# Patient Record
Sex: Male | Born: 1970 | Race: White | Hispanic: No | Marital: Single | Smoking: Former smoker
Health system: Southern US, Community
[De-identification: ages and names within clinical notes are randomized; demographics above are authoritative.]

## PROBLEM LIST (undated history)

## (undated) DIAGNOSIS — B20 Human immunodeficiency virus [HIV] disease: Secondary | ICD-10-CM

## (undated) DIAGNOSIS — Z21 Asymptomatic human immunodeficiency virus [HIV] infection status: Secondary | ICD-10-CM

## (undated) DIAGNOSIS — E785 Hyperlipidemia, unspecified: Secondary | ICD-10-CM

## (undated) DIAGNOSIS — F419 Anxiety disorder, unspecified: Secondary | ICD-10-CM

---

## 2014-09-29 ENCOUNTER — Emergency Department: Payer: BLUE CROSS/BLUE SHIELD

## 2014-09-29 ENCOUNTER — Encounter: Payer: Self-pay | Admitting: Emergency Medicine

## 2014-09-29 ENCOUNTER — Emergency Department
Admission: EM | Admit: 2014-09-29 | Discharge: 2014-09-29 | Disposition: A | Discharge status: Home / Self Care | Payer: BLUE CROSS/BLUE SHIELD | Attending: Emergency Medicine | Admitting: Emergency Medicine

## 2014-09-29 DIAGNOSIS — R000 Tachycardia, unspecified: Secondary | ICD-10-CM | POA: Diagnosis not present

## 2014-09-29 DIAGNOSIS — F419 Anxiety disorder, unspecified: Secondary | ICD-10-CM | POA: Diagnosis not present

## 2014-09-29 DIAGNOSIS — Z720 Tobacco use: Secondary | ICD-10-CM | POA: Insufficient documentation

## 2014-09-29 DIAGNOSIS — Z79899 Other long term (current) drug therapy: Secondary | ICD-10-CM | POA: Diagnosis not present

## 2014-09-29 DIAGNOSIS — R61 Generalized hyperhidrosis: Secondary | ICD-10-CM | POA: Diagnosis present

## 2014-09-29 DIAGNOSIS — R0789 Other chest pain: Secondary | ICD-10-CM | POA: Insufficient documentation

## 2014-09-29 DIAGNOSIS — E785 Hyperlipidemia, unspecified: Secondary | ICD-10-CM | POA: Diagnosis not present

## 2014-09-29 DIAGNOSIS — Z21 Asymptomatic human immunodeficiency virus [HIV] infection status: Secondary | ICD-10-CM | POA: Diagnosis not present

## 2014-09-29 DIAGNOSIS — M6281 Muscle weakness (generalized): Secondary | ICD-10-CM | POA: Insufficient documentation

## 2014-09-29 HISTORY — DX: Hyperlipidemia, unspecified: E78.5

## 2014-09-29 HISTORY — DX: Anxiety disorder, unspecified: F41.9

## 2014-09-29 HISTORY — DX: Asymptomatic human immunodeficiency virus (hiv) infection status: Z21

## 2014-09-29 HISTORY — DX: Human immunodeficiency virus (HIV) disease: B20

## 2014-09-29 LAB — COMPREHENSIVE METABOLIC PANEL
ALK PHOS: 95 U/L (ref 38–126)
ALT: 25 U/L (ref 17–63)
ANION GAP: 11 (ref 5–15)
AST: 40 U/L (ref 15–41)
Albumin: 4.3 g/dL (ref 3.5–5.0)
BILIRUBIN TOTAL: 1 mg/dL (ref 0.3–1.2)
BUN: 17 mg/dL (ref 6–20)
CALCIUM: 9.3 mg/dL (ref 8.9–10.3)
CO2: 22 mmol/L (ref 22–32)
Chloride: 103 mmol/L (ref 101–111)
Creatinine, Ser: 1.18 mg/dL (ref 0.61–1.24)
Glucose, Bld: 144 mg/dL — ABNORMAL HIGH (ref 65–99)
Potassium: 3.4 mmol/L — ABNORMAL LOW (ref 3.5–5.1)
Sodium: 136 mmol/L (ref 135–145)
TOTAL PROTEIN: 8 g/dL (ref 6.5–8.1)

## 2014-09-29 LAB — CBC WITH DIFFERENTIAL/PLATELET
BASOS ABS: 0 10*3/uL (ref 0–0.1)
BASOS PCT: 0 %
EOS PCT: 1 %
Eosinophils Absolute: 0.1 10*3/uL (ref 0–0.7)
HCT: 42.7 % (ref 40.0–52.0)
Hemoglobin: 14.2 g/dL (ref 13.0–18.0)
Lymphocytes Relative: 17 %
Lymphs Abs: 1.5 10*3/uL (ref 1.0–3.6)
MCH: 31.7 pg (ref 26.0–34.0)
MCHC: 33.2 g/dL (ref 32.0–36.0)
MCV: 95.4 fL (ref 80.0–100.0)
MONO ABS: 1 10*3/uL (ref 0.2–1.0)
Monocytes Relative: 11 %
Neutro Abs: 6.6 10*3/uL — ABNORMAL HIGH (ref 1.4–6.5)
Neutrophils Relative %: 71 %
PLATELETS: 274 10*3/uL (ref 150–440)
RBC: 4.47 MIL/uL (ref 4.40–5.90)
RDW: 13.5 % (ref 11.5–14.5)
WBC: 9.3 10*3/uL (ref 3.8–10.6)

## 2014-09-29 LAB — TROPONIN I: Troponin I: <0.03 ng/mL (ref <0.031)

## 2014-09-29 LAB — LIPASE, BLOOD: Lipase: 27 U/L (ref 22–51)

## 2014-09-29 LAB — MAGNESIUM: MAGNESIUM: 1.9 mg/dL (ref 1.7–2.4)

## 2014-09-29 MED ORDER — SODIUM CHLORIDE 0.9 % IV BOLUS (SEPSIS)
1000.0000 mL | INTRAVENOUS | Status: AC
  Administered 2014-09-29: 1000 mL via INTRAVENOUS

## 2014-09-29 MED ORDER — ASPIRIN 81 MG PO CHEW: 324.0000 mg | CHEWABLE_TABLET | Freq: Once | ORAL | Status: DC

## 2014-09-29 NOTE — ED Provider Notes (Signed)
[facility] Emergency Department Provider Note  ____________________________________________  Time seen: Approximately 12:49 PM  I have reviewed the triage vital signs and the nursing notes.   HISTORY  Chief Complaint Leg Pain and Arm Pain    HPI Alan Deleon is a 44 y.o. male with a history of HIV (followed at [facility], well-controlled with last CD4 count several weeks ago greater than 900 and undetectable viral load) and severe anxiety who presents after an acute episode that started with a combination of "dizziness", left sided numbness/weakness of his left arm and left leg, and generalized, nonfocal chest pressure and diaphoresis.  He states that while this episode was happening he could not lift anything with his left arm and that he had to sit down before he fell down.  He was trying to speak but states that he could not force his mouth to make the words.  The whole episode lasted about 20 minutes and now he feels "95% back to normal".  He denies headache, chest pain (just the initial feeling of pressure), fever/chills, abdominal pain.  He had nausea with no vomiting and states he has had diarrhea several times recently.  He denies dysuria.  He has never had episodes like this in the past; he states that it "might be my anxiety, but it is much worse than before".  However, he also states that he has been under more stress than ever recently and yesterday was "the most stressful day of my life".  It not take his Klonopin until just before the episode began; he states that usually it takes 30-45 minutes for the Klonopin to start working, and he took his pill only about 10 minutes or less before the episode today.  He currently feels much better and denies any persistent focal neurological issues at this time.   Past Medical History  Diagnosis Date  . HIV (human immunodeficiency virus infection)   . Anxiety   . Hyperlipidemia     There are no active problems to display  for this patient.   No past surgical history on file.  Current Outpatient Rx  Name  Route  Sig  Dispense  Refill  . clonazePAM (KLONOPIN) 1 MG tablet   Oral   Take 1 mg by mouth 2 (two) times daily.           Allergies Bactrim  History reviewed. No pertinent family history.  Social History Social History  Substance Use Topics  . Smoking status: Current Every Day Smoker -- 1.00 packs/day  . Smokeless tobacco: None  . Alcohol Use: Yes    Review of Systems Constitutional: No fever/chills.  Diaphoresis with this episode. Eyes: No visual changes. ENT: No sore throat.  Reports he was having trouble forming words and that he bit his tongue.  He also reports difficulty swallowing but this is been the case for weeks Cardiovascular: Denies chest pain but does report chest pressure or discomfort during his "episode"  Respiratory: Denies shortness of breath. Gastrointestinal: No abdominal pain.  nausea, no vomiting.  Recent diarrhea.  No constipation. Genitourinary: Negative for dysuria. Musculoskeletal: Negative for back pain. Skin: Negative for rash. Neurological: Reports significant left arm and left leg weakness as well as apparent aphasia which all lasted about 20 minutes and is now resolved  10-point ROS otherwise negative.  ____________________________________________   PHYSICAL EXAM:  VITAL SIGNS: ED Triage Vitals  Enc Vitals Group     BP 09/29/14 1233 146/120 mmHg     Pulse Rate  09/29/14 1233 116     Resp 09/29/14 1233 17     Temp 09/29/14 1233 98.1 F (36.7 C)     Temp Source 09/29/14 1233 Oral     SpO2 09/29/14 1233 95 %     Weight 09/29/14 1233 200 lb (90.719 kg)     Height 09/29/14 1233 5' 10"  (1.778 m)     Head Cir --      Peak Flow --      Pain Score 09/29/14 1235 4     Pain Loc --      Pain Edu? --      Excl. in [place]? --     Constitutional: Alert and oriented. Well appearing and in no acute distress.  Mildly tachycardic in the 110s.   Eyes:  Conjunctivae are normal. PERRL. EOMI. Head: Atraumatic. Nose: No congestion/rhinnorhea. Mouth/Throat: Mucous membranes are moist.  Oropharynx non-erythematous. Neck: No stridor.   Cardiovascular: Tachycardia, regular rhythm. Grossly normal heart sounds.  Good peripheral circulation. Respiratory: Normal respiratory effort.  No retractions. Lungs CTAB. Gastrointestinal: Soft and nontender. No distention. No abdominal bruits. No CVA tenderness. Musculoskeletal: No lower extremity tenderness nor edema.  No joint effusions. Neurologic:  Normal speech and language. No gross focal neurologic deficits are appreciated; the patient has 5 out of 5 strength in bilateral upper and lower extremities with flexion/extension and abduction/adduction of all major muscle groups.  Sensation is intact throughout.  Finger to nose is normal with no evidence of dysmetria.  No ataxia. Skin:  Skin is warm, dry and intact. No rash noted. Psychiatric: Mood and affect are normal. Speech and behavior are normal.  ____________________________________________   LABS (all labs ordered are listed, but only abnormal results are displayed)  Labs Reviewed  COMPREHENSIVE METABOLIC PANEL - Abnormal; Notable for the following:    Potassium 3.4 (*)    Glucose, Bld 144 (*)    All other components within normal limits  CBC WITH DIFFERENTIAL/PLATELET - Abnormal; Notable for the following:    Neutro Abs 6.6 (*)    All other components within normal limits  LIPASE, BLOOD  MAGNESIUM  TROPONIN I  TROPONIN I   ____________________________________________  EKG  ED ECG REPORT I, FORBACH, CORY, the attending physician, personally viewed and interpreted this ECG.  Date: 09/29/2014 EKG Time: 12:32 Rate: 121 Rhythm: Sinus tachycardia QRS Axis: normal Intervals: normal ST/T Wave abnormalities: normal Conduction Disutrbances: none Narrative Interpretation:  unremarkable  ____________________________________________  RADIOLOGY   Ct Head Wo Contrast  09/29/2014   CLINICAL DATA:  44 year old male with acute left-sided numbness today.  EXAM: CT HEAD WITHOUT CONTRAST  TECHNIQUE: Contiguous axial images were obtained from the base of the skull through the vertex without intravenous contrast.  COMPARISON:  None.  FINDINGS: No intracranial abnormalities are identified, including mass lesion or mass effect, hydrocephalus, extra-axial fluid collection, midline shift, hemorrhage, or acute infarction.  The visualized bony calvarium is unremarkable.  IMPRESSION: Unremarkable noncontrast head CT.   Electronically Signed   By: Margarette Canada M.D.   On: 09/29/2014 13:34   Mr Brain Wo Contrast  09/29/2014   CLINICAL DATA:  44 year old HIV positive male with hyperlipidemia presenting with left-sided pain and weakness with difficulty speaking earlier today. Symptoms now resolved. Subsequent encounter.  EXAM: MRI HEAD WITHOUT CONTRAST  TECHNIQUE: Multiplanar, multiecho pulse sequences of the brain and surrounding structures were obtained without intravenous contrast.  COMPARISON:  09/29/2014 CT.  No comparison MR.  FINDINGS: No acute infarct.  Mild white matter type changes which  may reflect result of small vessel disease small vessel disease type. Other causes of white matter type changes such as that secondary to inflammatory process, vasculitis, demyelinating process or migraine headaches are less likely considerations.  No intracranial hemorrhage.  No hydrocephalus.  No intracranial mass lesion noted on this unenhanced exam.  Major intracranial vascular structures are patent.  Cervical medullary junction, pituitary region, pineal region and orbital structures unremarkable.  IMPRESSION: No acute infarct.  Mild white matter type changes may reflect result of small vessel disease as noted above.   Electronically Signed   By: Genia Del M.D.   On: 09/29/2014 15:15   Dg Chest  Portable 1 View  09/29/2014   CLINICAL DATA:  acute onset CP w/ neurological deficits, HIV positive  EXAM: PORTABLE CHEST - 1 VIEW  COMPARISON:  None.  FINDINGS: The heart size and mediastinal contours are within normal limits. Both lungs are clear. The visualized skeletal structures are unremarkable.  IMPRESSION: No active disease.   Electronically Signed   By: Skipper Cliche M.D.   On: 09/29/2014 13:23    ____________________________________________   PROCEDURES  Procedure(s) performed: None  Critical Care performed: No ____________________________________________   INITIAL IMPRESSION / ASSESSMENT AND PLAN / ED COURSE  Pertinent labs & imaging results that were available during my care of the patient were reviewed by me and considered in my medical decision making (see chart for details).  NIH Stroke Scale  Interval: Baseline Time: Approximately 12:50 PM Person Administering Scale: FORBACH, CORY  Administer stroke scale items in the order listed. Record performance in each category after each subscale exam. Do not go back and change scores. Follow directions provided for each exam technique. Scores should reflect what the patient does, not what the clinician thinks the patient can do. The clinician should record answers while administering the exam and work quickly. Except where indicated, the patient should not be coached (i.e., repeated requests to patient to make a special effort).   1a  Level of consciousness: 0=alert; keenly responsive  1b. LOC questions:  0=Performs both tasks correctly  1c. LOC commands: 0=Performs both tasks correctly  2.  Best Gaze: 0=normal  3.  Visual: 0=No visual loss  4. Facial Palsy: 0=Normal symmetric movement  5a.  Motor left arm: 0=No drift, limb holds 90 (or 45) degrees for full 10 seconds  5b.  Motor right arm: 0=No drift, limb holds 90 (or 45) degrees for full 10 seconds  6a. motor left leg: 0=No drift, limb holds 90 (or 45) degrees for  full 10 seconds  6b  Motor right leg:  0=No drift, limb holds 90 (or 45) degrees for full 10 seconds  7. Limb Ataxia: 0=Absent  8.  Sensory: 0=Normal; no sensory loss  9. Best Language:  0=No aphasia, normal  10. Dysarthria: 0=Normal  11. Extinction and Inattention: 0=No abnormality  12. Distal motor function: 0=Normal   Total:   0   While the symptoms the patient describes are concerning for CVA/TIA, he currently has an NIH stroke scale is 0 and does not qualify as a candidate for TPA given the rapid improvement of symptoms and the current apparent complete resolution of the symptoms.  I suspect that this is all related to his anxiety, but his constellation of symptoms reported are concerning.  Given the chest tightness/pressure and neurological deficits, I specifically and especially considered aortic dissection in my differential.  However he insists that he never had chest "pain", just a feeling of tightness and  pressure while he was having his other symptoms.  He has essentially equal pulses in each arm, no widened mediastinum, normal pulses distally, and continues to have no chest pain.  I believe that there is no indication for pursuing a CT angiography of his chest at this time as aortic dissection is very unlikely at this point.  Given his constellation of symptoms including the focal neurological deficits and aphasia which is since resolved, I will pursue an MRI of the brain though I suspect this will be normal.  If he continues to feel well and is asymptomatic anticipate he will be appropriate for outpatient follow-up.  I am giving him a liter of normal saline for his mild tachycardia and he is are taken a Klonopin 1 mg by mouth prior to arrival.  I will continue to evaluate and reassess.  ----------------------------------------- 4:24 PM on 09/29/2014 -----------------------------------------  The patient's heart rate has resolved down to the 80s after a liter of normal saline.  He has  been asymptomatic throughout his stay in the emergency department.  All of his workup was reassuring.  I had a discussion with him and his mother.  I explained that while I cannot definitively rule out a TIA, I believe that it is much more likely that anxiety is the cause of his symptoms and his episode today.  He is enthusiastically agreed.  The plan at this time is for the patient to follow up with his regular doctor as well as attempting to establish primary care closer to [facility].  I gave him my usual and customary return precautions and the patient understands and agrees with plan.  ____________________________________________  FINAL CLINICAL IMPRESSION(S) / ED DIAGNOSES  Final diagnoses:  Anxiety disorder, unspecified anxiety disorder type      NEW MEDICATIONS STARTED DURING THIS VISIT:  Discharge Medication List as of 09/29/2014  4:27 PM       Hinda Kehr, MD 09/29/14 1718

## 2014-09-29 NOTE — ED Notes (Signed)
Patient transported to MRI. Pt taken to MRI by this RN.

## 2014-09-29 NOTE — ED Notes (Signed)
Pt arrives via [facility].  Upon arrival to ED, pt c/o of left arm pain and left leg pain.  Pt reports that at onset of symptoms he had difficulty talking. He took 324 ASA prior to EMS arrival.

## 2014-09-29 NOTE — Discharge Instructions (Signed)
As we discussed, your evaluation was reassuring today.  While we cannot 100% rule out that you had a transient ischemic attack (TIA), we all agree it is much more likely that you suffered from an especially severe anxiety attack.  If you are not already taking a low-dose 81 mg aspirin daily, I encourage you to do so, just in case.  Follow-up with your regular doctor at the next available opportunity to discuss your episode today and your emergency department visit.  If he develop new or worsening symptoms that concern you, please return immediately to the emergency department.  I included some information at the back of this packet about TIAs that I encourage you to read through and discussed with your regular doctor.  Panic Attacks Panic attacks are sudden, short-livedsurges of severe anxiety, fear, or discomfort. They may occur for no reason when you are relaxed, when you are anxious, or when you are sleeping. Panic attacks may occur for a number of reasons:   Healthy people occasionally have panic attacks in extreme, life-threatening situations, such as war or natural disasters. Normal anxiety is a protective mechanism of the body that helps [place] react to danger (fight or flight response).  Panic attacks are often seen with anxiety disorders, such as panic disorder, social anxiety disorder, generalized anxiety disorder, and phobias. Anxiety disorders cause excessive or uncontrollable anxiety. They may interfere with your relationships or other life activities.  Panic attacks are sometimes seen with other mental illnesses, such as depression and posttraumatic stress disorder.  Certain medical conditions, prescription medicines, and drugs of abuse can cause panic attacks. SYMPTOMS  Panic attacks start suddenly, peak within 20 minutes, and are accompanied by four or more of the following symptoms:  Pounding heart or fast heart rate (palpitations).  Sweating.  Trembling or shaking.  Shortness of  breath or feeling smothered.  Feeling choked.  Chest pain or discomfort.  Nausea or strange feeling in your stomach.  Dizziness, light-headedness, or feeling like you will faint.  Chills or hot flushes.  Numbness or tingling in your lips or hands and feet.  Feeling that things are not real or feeling that you are not yourself.  Fear of losing control or going crazy.  Fear of dying. Some of these symptoms can mimic serious medical conditions. For example, you may think you are having a heart attack. Although panic attacks can be very scary, they are not life threatening. DIAGNOSIS  Panic attacks are diagnosed through an assessment by your health care provider. Your health care provider will ask questions about your symptoms, such as where and when they occurred. Your health care provider will also ask about your medical history and use of alcohol and drugs, including prescription medicines. Your health care provider may order blood tests or other studies to rule out a serious medical condition. Your health care provider may refer you to a mental health professional for further evaluation. TREATMENT   Most healthy people who have one or two panic attacks in an extreme, life-threatening situation will not require treatment.  The treatment for panic attacks associated with anxiety disorders or other mental illness typically involves counseling with a mental health professional, medicine, or a combination of both. Your health care provider will help determine what treatment is best for you.  Panic attacks due to physical illness usually go away with treatment of the illness. If prescription medicine is causing panic attacks, talk with your health care provider about stopping the medicine, decreasing the dose, or  substituting another medicine.  Panic attacks due to alcohol or drug abuse go away with abstinence. Some adults need professional help in order to stop drinking or using drugs. HOME  CARE INSTRUCTIONS   Take all medicines as directed by your health care provider.   Schedule and attend follow-up visits as directed by your health care provider. It is important to keep all your appointments. SEEK MEDICAL CARE IF:  You are not able to take your medicines as prescribed.  Your symptoms do not improve or get worse. SEEK IMMEDIATE MEDICAL CARE IF:   You experience panic attack symptoms that are different than your usual symptoms.  You have serious thoughts about hurting yourself or others.  You are taking medicine for panic attacks and have a serious side effect. MAKE SURE YOU:  Understand these instructions.  Will watch your condition.  Will get help right away if you are not doing well or get worse. Document Released: 12/27/2004 Document Revised: 01/01/2013 Document Reviewed: 08/10/2012 [facility] Patient Information 2015 [place], [place]. This information is not intended to replace advice given to you by your health care provider. Make sure you discuss any questions you have with your health care provider.

## 2016-12-13 IMAGING — MR MR HEAD W/O CM
10 series · 41 of 48 positions shown · non-contrast
Comparison: 09/29/2014 CT.  No comparison MR.

CLINICAL DATA: 48-year-old HIV positive male with hyperlipidemia
presenting with left-sided pain and weakness with difficulty
speaking earlier today. Symptoms now resolved. Subsequent encounter.

EXAM:
MRI HEAD WITHOUT CONTRAST
TECHNIQUE: Multiplanar, multiecho pulse sequences of the brain and surrounding
structures were obtained without intravenous contrast.

[Series 2: T1 · sagittal · 5.0mm · 0.45mm/px · 4 of 25 slices shown (1 of 2)]
[im 1/25]
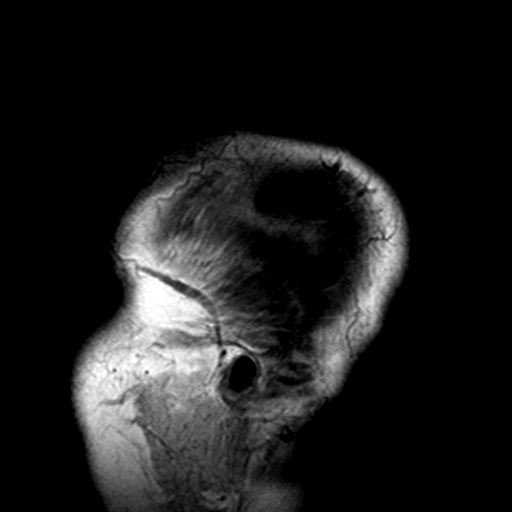
[im 9/25]
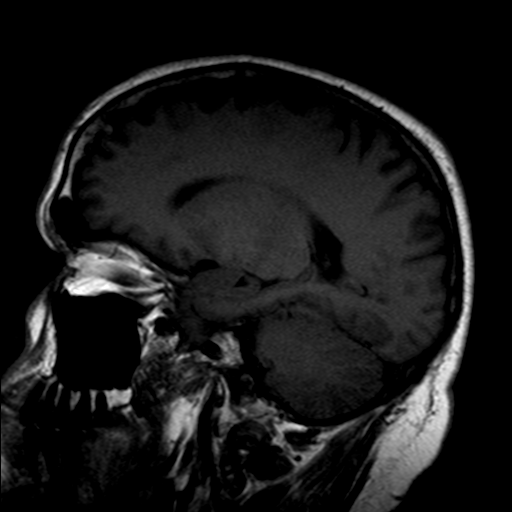
[im 17/25]
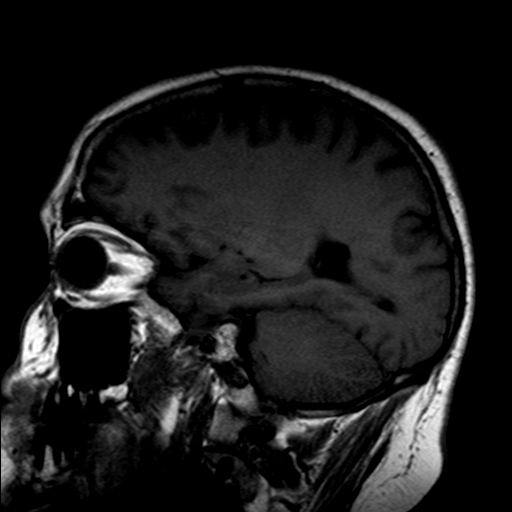
[im 25/25]
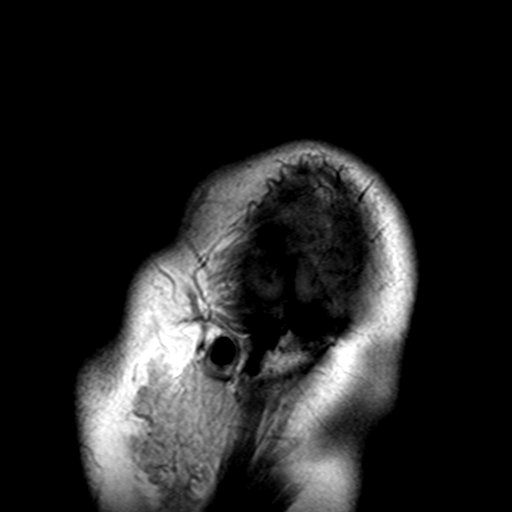

[Series 4: DWI · axial · 4.0mm · 0.94mm/px · z∈[-56,+111]mm · 7 of 44 slices shown (1 of 4)]
[im 1/44]
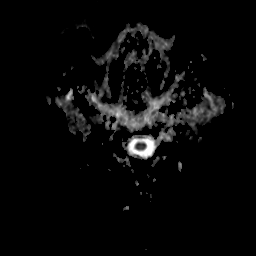
[im 8/44]
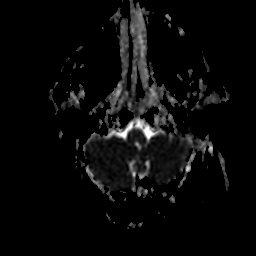
[im 15/44]
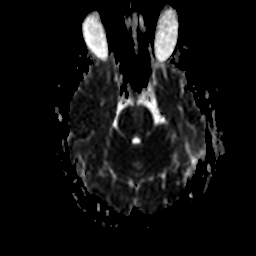
[im 22/44]
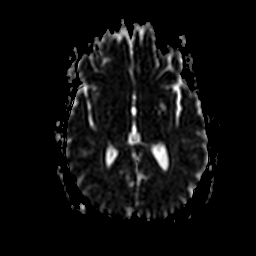
[im 29/44]
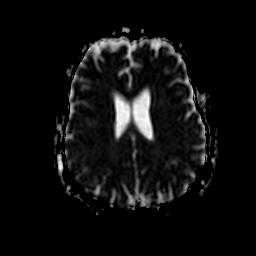
[im 36/44]
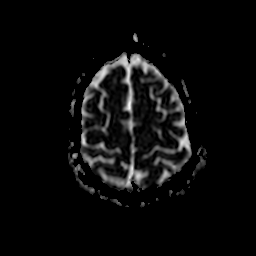
[im 44/44]
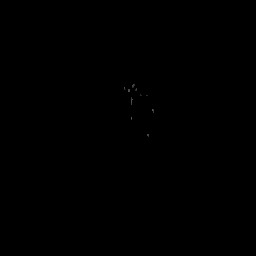

[Series 5: DWI · axial · 4.0mm · 0.94mm/px · z∈[-56,+111]mm · 6 of 44 slices shown (2 of 4)]
[im 1/44]
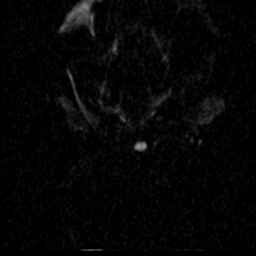
[im 9/44]
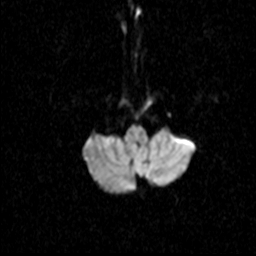
[im 18/44]
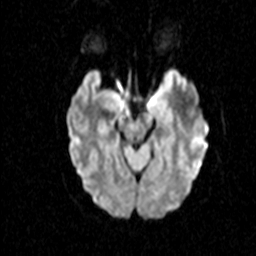
[im 26/44]
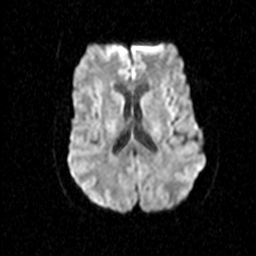
[im 35/44]
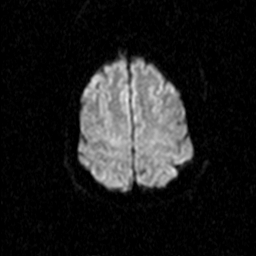
[im 44/44]
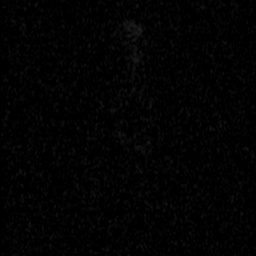

[Series 7: DWI · coronal · 5.0mm · 1.80mm/px · 5 of 37 slices shown (3 of 4)]
[im 1/37]
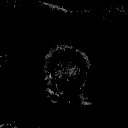
[im 10/37]
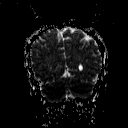
[im 19/37]
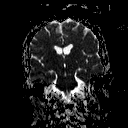
[im 28/37]
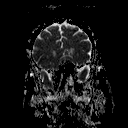
[im 37/37]
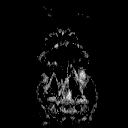

[Series 8: DWI · coronal · 5.0mm · 1.80mm/px · 5 of 36 slices shown (4 of 4)]
[im 1/36]
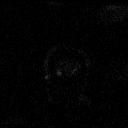
[im 9/36]
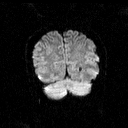
[im 18/36]
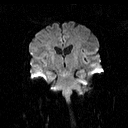
[im 27/36]
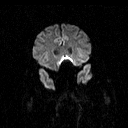
[im 36/36]
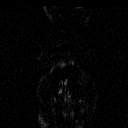

[Series 9: T2 · axial · 5.0mm · 0.45mm/px · z∈[-46,+106]mm · 3 of 25 slices shown (1 of 3)]
[im 1/25]
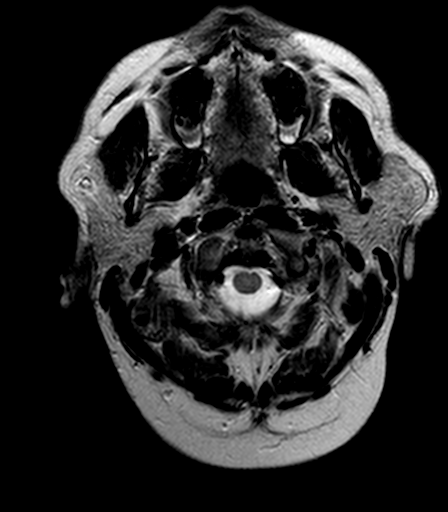
[im 13/25]
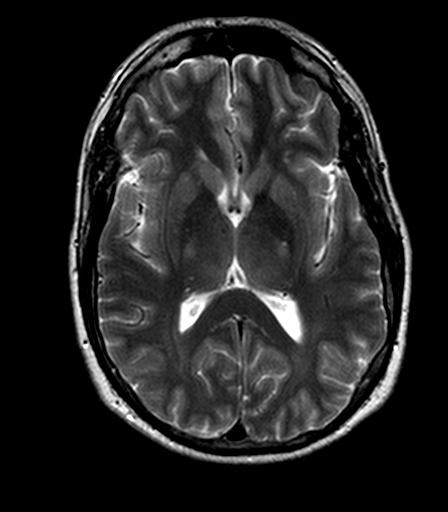
[im 25/25]
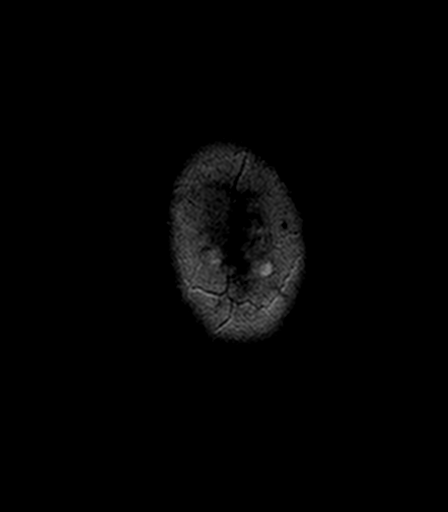

[Series 10: FLAIR · axial · 5.0mm · 0.90mm/px · z∈[-45,+107]mm · 3 of 25 slices shown]
[im 1/25]
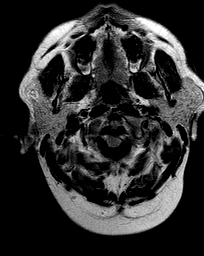
[im 13/25]
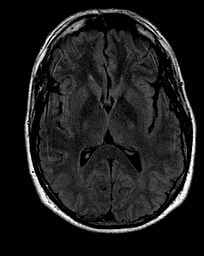
[im 25/25]
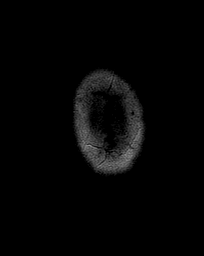

[Series 11: T2 · axial · 5.0mm · 0.45mm/px · z∈[-50,+102]mm · 3 of 25 slices shown (2 of 3)]
[im 1/25]
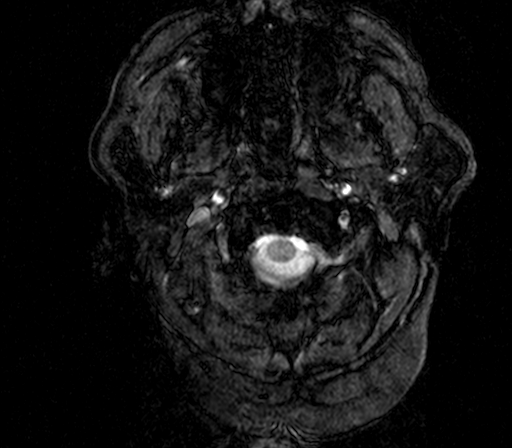
[im 13/25]
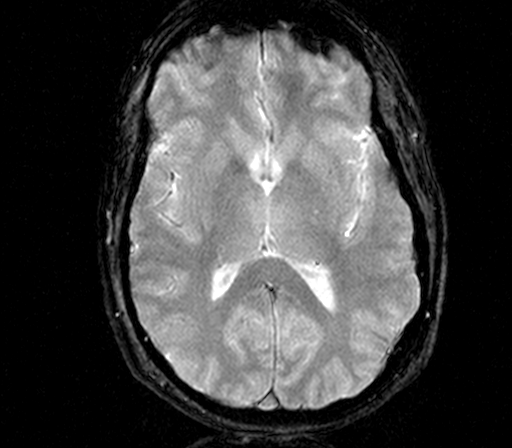
[im 25/25]
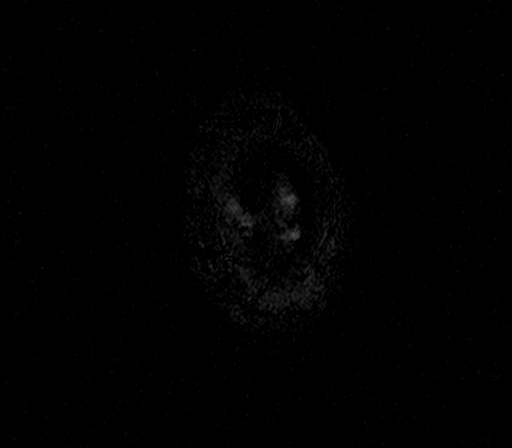

[Series 12: T1 · axial · 3.0mm · 0.45mm/px · 1 of 56 slices shown (2 of 2)]
[im 1/56]
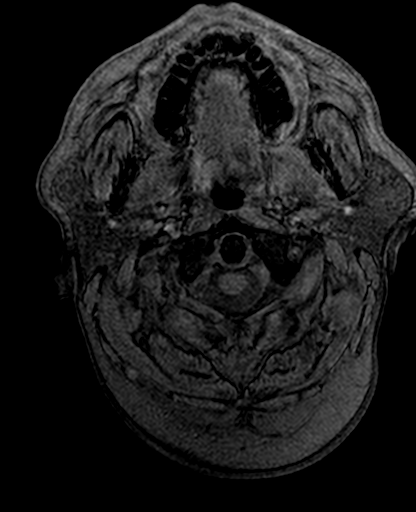

[Series 13: T2 · coronal · 5.0mm · 0.45mm/px · 4 of 29 slices shown (3 of 3)]
[im 1/29]
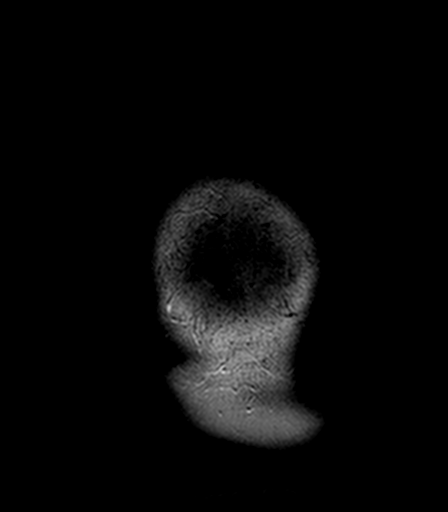
[im 10/29]
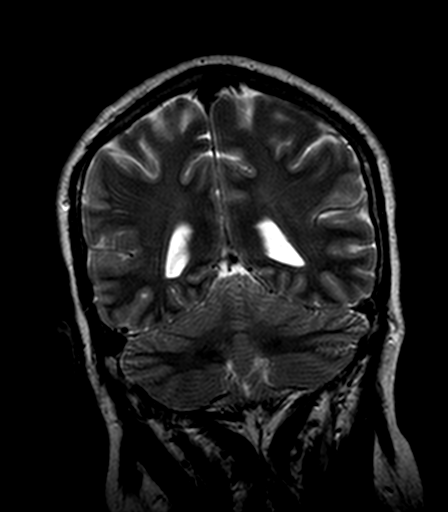
[im 19/29]
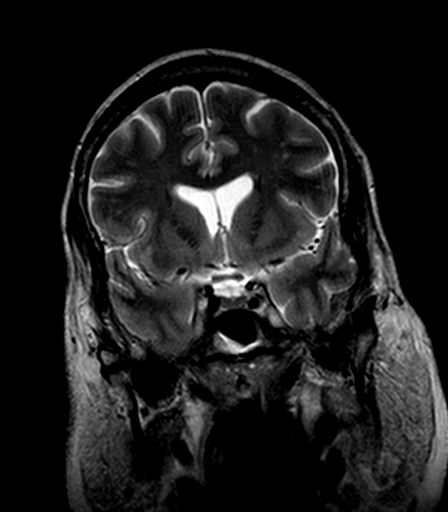
[im 29/29]
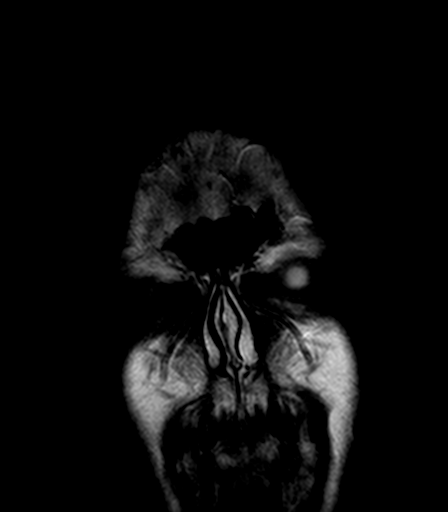

[41 of 48 positions shown; findings below may reference images not displayed]

FINDINGS: No acute infarct.

Mild white matter type changes which may reflect result of small
vessel disease small vessel disease type. Other causes of white
matter type changes such as that secondary to inflammatory process,
vasculitis, demyelinating process or migraine headaches are less
likely considerations.

No intracranial hemorrhage.

No hydrocephalus.

No intracranial mass lesion noted on this unenhanced exam.

Major intracranial vascular structures are patent.

Cervical medullary junction, pituitary region, pineal region and
orbital structures unremarkable.
IMPRESSION: No acute infarct.

Mild white matter type changes may reflect result of small vessel
disease as noted above.

## 2018-12-28 ENCOUNTER — Ambulatory Visit: Payer: BLUE CROSS/BLUE SHIELD | Attending: Internal Medicine

## 2018-12-28 ENCOUNTER — Other Ambulatory Visit: Payer: Self-pay

## 2018-12-28 DIAGNOSIS — Z20822 Contact with and (suspected) exposure to covid-19: Secondary | ICD-10-CM

## 2018-12-29 LAB — NOVEL CORONAVIRUS, NAA: SARS-CoV-2, NAA: NOT DETECTED

## 2019-01-09 ENCOUNTER — Ambulatory Visit: Payer: BLUE CROSS/BLUE SHIELD | Attending: Internal Medicine

## 2019-01-09 DIAGNOSIS — Z20822 Contact with and (suspected) exposure to covid-19: Secondary | ICD-10-CM

## 2019-01-10 LAB — NOVEL CORONAVIRUS, NAA: SARS-CoV-2, NAA: DETECTED — AB

## 2019-01-17 ENCOUNTER — Ambulatory Visit: Payer: BLUE CROSS/BLUE SHIELD | Attending: Internal Medicine

## 2019-01-17 DIAGNOSIS — Z20822 Contact with and (suspected) exposure to covid-19: Secondary | ICD-10-CM

## 2019-01-18 ENCOUNTER — Other Ambulatory Visit: Payer: BLUE CROSS/BLUE SHIELD

## 2019-01-19 LAB — NOVEL CORONAVIRUS, NAA: SARS-CoV-2, NAA: DETECTED — AB

## 2019-01-23 ENCOUNTER — Other Ambulatory Visit: Payer: BLUE CROSS/BLUE SHIELD

## 2020-09-10 LAB — COLOGUARD: COLOGUARD: NEGATIVE

## 2022-07-18 ENCOUNTER — Ambulatory Visit
Admission: RE | Admit: 2022-07-18 | Discharge: 2022-07-18 | Disposition: A | Discharge status: Home / Self Care | Payer: BC Managed Care – PPO | Source: Ambulatory Visit | Attending: Family Medicine | Admitting: Family Medicine

## 2022-07-18 ENCOUNTER — Other Ambulatory Visit: Payer: Self-pay | Admitting: Family Medicine

## 2022-07-18 DIAGNOSIS — R200 Anesthesia of skin: Secondary | ICD-10-CM

## 2022-07-18 DIAGNOSIS — R519 Headache, unspecified: Secondary | ICD-10-CM

## 2022-07-18 DIAGNOSIS — R42 Dizziness and giddiness: Secondary | ICD-10-CM

## 2022-07-20 ENCOUNTER — Other Ambulatory Visit: Payer: Self-pay | Admitting: Family Medicine

## 2022-07-20 ENCOUNTER — Encounter: Payer: Self-pay | Admitting: Family Medicine

## 2022-07-20 DIAGNOSIS — R200 Anesthesia of skin: Secondary | ICD-10-CM

## 2023-02-14 ENCOUNTER — Other Ambulatory Visit: Payer: Self-pay | Admitting: Emergency Medicine

## 2023-02-14 DIAGNOSIS — Z122 Encounter for screening for malignant neoplasm of respiratory organs: Secondary | ICD-10-CM

## 2023-02-14 DIAGNOSIS — Z87891 Personal history of nicotine dependence: Secondary | ICD-10-CM

## 2023-02-22 ENCOUNTER — Ambulatory Visit
Admission: RE | Admit: 2023-02-22 | Discharge: 2023-02-22 | Disposition: A | Discharge status: Home / Self Care | Payer: 59 | Source: Ambulatory Visit | Attending: Emergency Medicine | Admitting: Emergency Medicine

## 2023-02-22 DIAGNOSIS — Z87891 Personal history of nicotine dependence: Secondary | ICD-10-CM | POA: Insufficient documentation

## 2023-02-22 DIAGNOSIS — Z122 Encounter for screening for malignant neoplasm of respiratory organs: Secondary | ICD-10-CM | POA: Diagnosis present

## 2023-10-23 LAB — COLOGUARD: COLOGUARD: NEGATIVE

## 2024-01-18 ENCOUNTER — Other Ambulatory Visit: Payer: Self-pay

## 2024-01-18 ENCOUNTER — Emergency Department

## 2024-01-18 ENCOUNTER — Encounter: Payer: Self-pay | Admitting: *Deleted

## 2024-01-18 ENCOUNTER — Emergency Department
Admission: EM | Admit: 2024-01-18 | Discharge: 2024-01-18 | Disposition: A | Discharge status: Home / Self Care | Attending: Emergency Medicine | Admitting: Emergency Medicine

## 2024-01-18 DIAGNOSIS — N132 Hydronephrosis with renal and ureteral calculous obstruction: Secondary | ICD-10-CM | POA: Diagnosis not present

## 2024-01-18 DIAGNOSIS — Z21 Asymptomatic human immunodeficiency virus [HIV] infection status: Secondary | ICD-10-CM | POA: Insufficient documentation

## 2024-01-18 DIAGNOSIS — R319 Hematuria, unspecified: Secondary | ICD-10-CM | POA: Diagnosis present

## 2024-01-18 DIAGNOSIS — N201 Calculus of ureter: Secondary | ICD-10-CM

## 2024-01-18 DIAGNOSIS — I10 Essential (primary) hypertension: Secondary | ICD-10-CM | POA: Diagnosis not present

## 2024-01-18 DIAGNOSIS — R310 Gross hematuria: Secondary | ICD-10-CM

## 2024-01-18 LAB — CBC
HCT: 38.8 % — ABNORMAL LOW (ref 39.0–52.0)
Hemoglobin: 13.7 g/dL (ref 13.0–17.0)
MCH: 32.5 pg (ref 26.0–34.0)
MCHC: 35.3 g/dL (ref 30.0–36.0)
MCV: 91.9 fL (ref 80.0–100.0)
Platelets: 280 K/uL (ref 150–400)
RBC: 4.22 MIL/uL (ref 4.22–5.81)
RDW: 12.5 % (ref 11.5–15.5)
WBC: 3.8 K/uL — ABNORMAL LOW (ref 4.0–10.5)
nRBC: 0 % (ref 0.0–0.2)

## 2024-01-18 LAB — URINALYSIS, ROUTINE W REFLEX MICROSCOPIC
Bacteria, UA: NONE SEEN
Bilirubin Urine: NEGATIVE
Glucose, UA: NEGATIVE mg/dL
Ketones, ur: NEGATIVE mg/dL
Leukocytes,Ua: NEGATIVE
Nitrite: NEGATIVE
Protein, ur: 30 mg/dL — AB
RBC / HPF: >50 RBC/hpf (ref 0–5)
Specific Gravity, Urine: 1.016 (ref 1.005–1.030)
Squamous Epithelial / HPF: 0 /HPF (ref 0–5)
pH: 5 (ref 5.0–8.0)

## 2024-01-18 LAB — BASIC METABOLIC PANEL WITH GFR
Anion gap: 12 (ref 5–15)
BUN: 27 mg/dL — ABNORMAL HIGH (ref 6–20)
CO2: 25 mmol/L (ref 22–32)
Calcium: 10 mg/dL (ref 8.9–10.3)
Chloride: 101 mmol/L (ref 98–111)
Creatinine, Ser: 1.17 mg/dL (ref 0.61–1.24)
GFR, Estimated: >60 mL/min
Glucose, Bld: 99 mg/dL (ref 70–99)
Potassium: 4.2 mmol/L (ref 3.5–5.1)
Sodium: 137 mmol/L (ref 135–145)

## 2024-01-18 NOTE — Progress Notes (Signed)
 " Alan Deleon is a 54 y.o. male here to for follow up  Chief Complaint Chief Complaint  Patient presents with   Follow-up   HPI He has a history of HIV, HLD and hypertriglyceridemia Had been having elevated BPs to 160s (had some work stress) and dark urine.  Had called and [facility]A Ordered but did not come in. He had played a hard tennis match the day before so thinks was dehydrated.  For BP he is on losartan. Resolved. ]CO R ant chest sharp pain when he laying down and R arm elevated He is on mounjaro 7.5 mg and wt improving and sugars very good. .Wt was 222 and now 184. Had CT lund cancer screening done which was negative for malignancy but did show some emphysema and two-vessel coronary arteriosclerosis. Remains on Biktarvy with no issues. He is on lipitor 40  mg and lopid- tolerating well. No myalgias.  Off chantix - quit reg cigarettes but very occas e cig.  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   Type 2 diabetes mellitus treated without insulin (CMS/HHS-HCC)   Upper respiratory symptom   Centrilobular emphysema (CMS/HHS-HCC)   Lung nodule   Myopia of both eyes with regular astigmatism   Past Medical History:  Diagnosis Date   Anxiety    Anxiety disorder    hx panic attacks; tx clonzepam   Avascular necrosis of bones of both hips (CMS/HHS-HCC)    Centrilobular emphysema (CMS/HHS-HCC) 03/16/2023   HIV -AIDS with opportunistic infection, Symptomatic (CMS/HHS-HCC) 2005   Hypertriglyceridemia    Pneumocystis carinii pneumonia (CMS/HHS-HCC) 2005   Jan 2005   Type 2 diabetes mellitus treated without insulin (CMS/HHS-HCC) 09/18/2022   Past Surgical History:  Procedure  Laterality Date   ARTHROPLASTY HIP TOTAL Bilateral 06/05/2013   Procedure: ARTHROPLASTY HIP TOTAL;  Surgeon: Jayson Elby Crome, MD;  Location: [facility];  Service: Orthopedics;  Laterality: Bilateral;   CARPECTOMY Right 10/03/2022   Procedure: RIGHT CARPECTOMY; 1 BONE;  Surgeon: Charlie Oliva Pac, MD;  Location: [facility];  Service: Orthopedics;  Laterality: Right;   INTRAOPERATIVE FLUOROSCOPY Right 10/03/2022   Procedure: FLUOROSCOPY;  Surgeon: Charlie Oliva Pac, MD;  Location: [facility];  Service: Orthopedics;  Laterality: Right;   HERNIA REPAIR     JOINT REPLACEMENT     Social History Social History   Socioeconomic History   Marital status: Single   Number of children: 0   Years of education: 16   Highest education level: Bachelor's degree (e.g., BA, AB, BS)  Occupational History   Occupation: Mudlogger    Comment: Self Employed   Tobacco Use   Smoking status: Former    Current packs/day: 0.00    Average packs/day: 1 pack/day for 25.0 years (25.0 ttl pk-yrs)    Types: Cigarettes    Start date: 10/10/1993    Quit date: 10/11/2018    Years since quitting: 5.2   Smokeless tobacco: Never  Vaping Use   Vaping status: Former   Quit date: 09/11/2022  Substance and Sexual Activity   Alcohol use: Yes    Comment: Rare consumption   Drug use: No   Sexual activity: Yes    Partners: Male  Birth control/protection: Condom  Social History Narrative   Mr. 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  He has a single regular male partner.  Quit smoking.   Social Drivers of Corporate Investment Banker Strain: Low Risk  (09/06/2023)   Overall Financial Resource Strain (CARDIA)    Difficulty of Paying Living Expenses: Not hard at all  Food Insecurity: No Food Insecurity (09/06/2023)   Hunger Vital Sign    Worried About Running Out of Food in the Last Year: Never true    Ran Out of Food in  the Last Year: Never true  Transportation Needs: No Transportation Needs (09/06/2023)   PRAPARE - Administrator, Civil Service (Medical): No    Lack of Transportation (Non-Medical): No  Housing Stability: Low Risk  (09/06/2023)   Housing Stability Vital Sign    Unable to Pay for Housing in the Last Year: No    Number of Times Moved in the Last Year: 0    Homeless in the Last Year: No    Family History Family History  Problem Relation Name Age of Onset   Diabetes type II Mother ZERIC Deleon    Stroke Father Alan Deleon    Anesthesia problems Neg Hx     Malignant hypertension Neg Hx     Malignant hyperthermia Neg Hx     Medication   Medication List       * Accurate as of January 18, 2024  8:40 AM. If you have any questions, ask your nurse or doctor.          CONTINUE taking these medications    amoxicillin 500 MG capsule Commonly known as: AMOXIL Take four tablets by mouth one hour prior to dental cleanings   atorvastatin 40 MG tablet Commonly known as: LIPITOR Take 1 tablet (40 mg total) by mouth once daily   bictegravir-emtricitabine-tenofovir alafenamide 50-200-25 mg tablet Commonly known as: BIKTARVY Take 1 tablet by mouth once daily   clonazePAM 1 MG tablet Commonly known as: KlonoPIN TAKE 1 TABLET(1 MG) BY MOUTH DAILY AS NEEDED FOR ANXIETY   escitalopram oxalate 10 MG tablet Commonly known as: LEXAPRO Take 1 tablet (10 mg total) by mouth once daily   gemfibrozil 600 mg tablet Commonly known as: LOPID Take 1 tablet (600 mg total) by mouth 2 (two) times daily before meals   losartan 100 MG tablet Commonly known as: COZAAR TAKE 1 TABLET(100 MG) BY MOUTH DAILY   MOUNJARO 7.5 mg/0.5 mL pen injector Generic drug: tirzepatide Inject 0.5 mLs (7.5 mg total) subcutaneously once a week   MULTIVITAMIN ORAL   niacin 500 MG CR capsule Take 1 capsule (500 mg total) by mouth once daily   omeprazole 20 MG DR capsule Commonly known as:  PriLOSEC       Allergy Allergies  Allergen Reactions   Septra [Sulfamethoxazole-Trimethoprim] Hives   Sulfa (Sulfonamide Antibiotics) Hives   Review of System A comprehensive 11 system ROS was negative except as per HPI  Physical exam BP 118/82   Pulse 93   Ht 177.8 cm (5' 10)   Wt 88.9 kg (196 lb)   SpO2 96%   BMI 28.12 kg/m   GENERAL:  The patient is alert, oriented and in no acute distress.  HEENT:  PERRLA.  Oropharynx moist. R cheek with oral lesion as below Head is normocephalic/atraumatic.  Pupils equal, round and reactive to light and accommodation.  Extraocular movements are intact. Nasal mucosa is normal.  External ear canals normal.  Tympanic membranes are clear.   NECK:  Supple without thyromegaly or lymphadenopathy.   LUNGS:  Clear to auscultation and percussion.   CARDIAC:  Regular rate and rhythm, normal S1 and S2 without murmurs, rubs or gallops.   VASCULAR: Carotid and radial pulses 2+.  Distal pulses are 2+.   ABDOMEN:  Soft, with normal bowel sounds.  No organomegaly or tenderness was found.   EXTREMITIES:  Full range of motion with no erythema, heat or effusions.  No cyanosis, clubbing or edema noted.   NEUROLOGIC:  The patient is alert and oriented.  Cranial nerves II-XII intact.  Motor and sensory examination within normal limits.   Gait normal.  Reflexes symmetric and brisk.   DERMATOLOGIC:  No skin lesions, scaling or bruising noted.    LYMPH:  No cervical or supraclavicular nodes.       Labs Patient Message on 10/02/2023  Component Date Value Ref Range Status   Cologuard 10/18/2023 Negative   Final  Office Visit on 09/06/2023  Component Date Value Ref Range Status   Cologuard 10/02/2023 Cancelled - Duplicate Order   Final  Appointment on 08/30/2023  Component Date Value Ref Range Status   WBC (White Blood Cell Count) 08/30/2023 3.2 (L)  4.1 - 10.2 103/uL Final   RBC (Red Blood Cell Count) 08/30/2023 4.36 (L)  4.69 - 6.13 106/uL Final    Hemoglobin 08/30/2023 14.0 (L)  14.1 - 18.1 gm/dL Final   Hematocrit 91/79/7974 40.6  40.0 - 52.0 % Final   MCV (Mean Corpuscular Volume) 08/30/2023 93.1  80.0 - 100.0 fl Final   MCH (Mean Corpuscular Hemoglobin) 08/30/2023 32.1 (H)  27.0 - 31.2 pg Final   MCHC (Mean Corpuscular Hemoglobin * 08/30/2023 34.5  32.0 - 36.0 gm/dL Final   Platelet Count 08/30/2023 273  150 - 450 103/uL Final   RDW-CV (Red Cell Distribution Widt* 08/30/2023 12.4  11.6 - 14.8 % Final   MPV (Mean Platelet Volume) 08/30/2023 10.2  9.4 - 12.4 fl Final   Neutrophils 08/30/2023 1.10 (L)  1.50 - 7.80 103/uL Final   Lymphocytes 08/30/2023 1.61  1.00 - 3.60 103/uL Final   Monocytes 08/30/2023 0.40  0.00 - 1.50 103/uL Final   Eosinophils 08/30/2023 0.07  0.00 - 0.55 103/uL Final   Basophils 08/30/2023 0.02  0.00 - 0.09 103/uL Final   Neutrophil % 08/30/2023 34.2  32.0 - 70.0 % Final   Lymphocyte % 08/30/2023 50.2 (H)  10.0 - 50.0 % Final   Monocyte % 08/30/2023 12.5  4.0 - 13.0 % Final   Eosinophil % 08/30/2023 2.2  1.0 - 5.0 % Final   Basophil% 08/30/2023 0.6  0.0 - 2.0 % Final   Immature Granulocyte % 08/30/2023 0.3  <=0.7 % Final   Immature Granulocyte Count 08/30/2023 0.01  <=0.06 10^3/L Final   Glucose 08/30/2023 80  70 - 110 mg/dL Final   Sodium 91/79/7974 140  136 - 145 mmol/L Final   Potassium 08/30/2023 3.9  3.6 - 5.1 mmol/L Final   Chloride 08/30/2023 105  97 - 109 mmol/L Final   Carbon Dioxide (CO2) 08/30/2023 25.5  22.0 - 32.0 mmol/L Final   Urea Nitrogen (BUN) 08/30/2023 14  7 - 25 mg/dL Final   Creatinine 91/79/7974 1.1  0.7 - 1.3 mg/dL Final   Glomerular Filtration Rate (eGFR) 08/30/2023 81  >60 mL/min/1.73sq m Final   Calcium 08/30/2023 9.0  8.7 - 10.3 mg/dL Final   AST  91/79/7974 28  8 -  39 U/L Final   ALT  08/30/2023 20  6 - 57 U/L Final   Alk Phos (alkaline Phosphatase) 08/30/2023 67  34 - 104 U/L Final   Albumin 08/30/2023 4.4  3.5 - 4.8 g/dL Final    Bilirubin, Total 08/30/2023 0.6  0.3 - 1.2 mg/dL Final   Protein, Total 08/30/2023 7.2  6.1 - 7.9 g/dL Final   A/G Ratio 91/79/7974 1.6  1.0 - 5.0 gm/dL Final   Hemoglobin J8R 08/30/2023 5.5  4.2 - 5.6 % Final   Average Blood Glucose (Calc) 08/30/2023 111  mg/dL Final   Cholesterol, Total 08/30/2023 236 (H)  100 - 200 mg/dL Final   Triglyceride 91/79/7974 130  35 - 199 mg/dL Final   HDL (High Density Lipoprotein) Cho* 08/30/2023 45.5  29.0 - 71.0 mg/dL Final   LDL Calculated 08/30/2023 834 (H)  0 - 130 mg/dL Final   VLDL Cholesterol 08/30/2023 26  mg/dL Final   Cholesterol/HDL Ratio 08/30/2023 5.2   Final   Thyroid Stimulating Hormone (TSH) 08/30/2023 1.538  0.450-5.330 uIU/ml uIU/mL Final   HIV-1 RNA by PCR - [facility] 08/30/2023 <20  copies/mL Final   log10 HIV-1 RNA - [facility] 08/30/2023 TNP  log10copy/mL Final   Assessment  Chevis Weisensel is a 53 y.o. male here for  Chief Complaint  Patient presents with   Follow-up   Recommendation  Diabetes - Prior  A1c up to 7.0. Did not tolerate metformin. Now on Mounjaro 7.5 mg and wt down and  A1c improving. 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 Continue to follow Lab Results  Component Value Date   WBC 3.2 (L) 08/30/2023   HGB 14.0 (L) 08/30/2023   HCT 40.6 08/30/2023   MCV 93.1 08/30/2023   PLT 273 08/30/2023    HIV-this is very well controlled on Biktarvy in 2020.    He tolerates it well.   Anal pap done 07/2020. Declines STD screen.  CD4 Absolute Count  Date Value Ref Range Status  08/25/2016 870 400-1,400 LYM/uL Final   Hypertriglyceridemia.  He remains on Lopid and lipitor. - is still on niacin.   No results found for: CHOL Lab Results  Component Value Date   HDL 45.5 08/30/2023   HDL 64.8 01/26/2023   HDL 71.5 (L) 09/20/2022   Lab  Results  Component Value Date   LDLCALC 165 (H) 08/30/2023   LDLCALC 111 01/26/2023   LDLCALC 124 09/20/2022   Lab Results  Component Value Date   TRIG 130 08/30/2023   TRIG 151 01/26/2023   TRIG 165 09/20/2022   No results found for: CHOLHDL  Hyperlipidemia - . No myalgias on lipitor and niacin No results found for: CHOL Lab Results  Component Value Date   HDL 45.5 08/30/2023   HDL 64.8 01/26/2023   HDL 71.5 (L) 09/20/2022   Lab Results  Component Value Date   LDLCALC 165 (H) 08/30/2023   LDLCALC 111 01/26/2023   LDLCALC 124 09/20/2022   Lab Results  Component Value Date   TRIG 130 08/30/2023   TRIG 151 01/26/2023   TRIG 165 09/20/2022   No results found for: CHOLHDL   Hypertension - BP well controlled at home on current med Losartan 100. Had recent increase with stress but now normal Most recent basic metabolic panel stable. Continue current  meds  BP Readings from Last 3 Encounters:  01/18/24 118/82  11/20/23 (!) 132/90  09/06/23 122/78   Lab Results  Component Value Date   CREATININE 1.1 08/30/2023   BUN 14 08/30/2023   NA 140 08/30/2023   K 3.9 08/30/2023   CL 105 08/30/2023   CO2 25.5 08/30/2023   Anxiety - Depression - lexapro and clonazepam prn.  Tobacco abuse- he has quit and is on non tobacco E cigs.   Lung cancer screening- done 2025  CAD - noted on CT lung cancer. No sxs and very active. Cont risk factor modifications.  Dizziness- neg  carotids.  Oral lesion - has seen dentist and told due to repeat biting injury.  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 He is a former smoker. Dentist did not recommend bxp *Some images could not be shown."

## 2024-01-18 NOTE — Discharge Instructions (Signed)
 You have a small kidney stone which is likely to pass on its own.  Make an appointment to follow-up with urologist in 1 to 2 weeks if you continue to have blood in the urine, as if this becomes persistent you might need further workup.  Return to the ER for new, worsening, or persistent severe bleeding in the urine, abdominal or flank pain, inability to urinate, or any other new or worsening symptoms that concern you.

## 2024-01-18 NOTE — ED Triage Notes (Signed)
 Pt ambulatory to triage.  Pt reports hematuria.  Pt has left flank pain.  Sx for 2 days.  Hematuria for 1.5 weeks.  No n/v/   pt alert  speech clear.

## 2024-01-18 NOTE — ED Provider Notes (Signed)
 "  [facility] Provider Note    Event Date/Time   First MD Initiated Contact with Patient 01/18/24 2128     (approximate)   History   Hematuria   HPI  Alan Deleon is a 54 y.o. male with history of HIV, hypertension, and hyperlipidemia who presents with hematuria initially occurring about 2 weeks ago, then resolving, now recurring today.  He reports gross blood in the urine and states his urine looks like fruit punch.  He reports some dysuria but no frequency.  He has also had some left groin and flank pain.  He denies nausea or vomiting.  He has no fever or chills.  I reviewed the past medical records.  The patient was seen by internal medicine this morning for follow-up of his chronic conditions.   Physical Exam   Triage Vital Signs: ED Triage Vitals  Encounter Vitals Group     BP 01/18/24 2005 (!) 135/99     Girls Systolic BP Percentile --      Girls Diastolic BP Percentile --      Boys Systolic BP Percentile --      Boys Diastolic BP Percentile --      Pulse Rate 01/18/24 2005 83     Resp 01/18/24 2005 18     Temp 01/18/24 2005 98.4 F (36.9 C)     Temp Source 01/18/24 2005 Oral     SpO2 01/18/24 2005 99 %     Weight 01/18/24 2002 195 lb (88.5 kg)     Height 01/18/24 2002 5' 7 (1.702 m)     Head Circumference --      Peak Flow --      Pain Score 01/18/24 2002 4     Pain Loc --      Pain Education --      Exclude from Growth Chart --     Most recent vital signs: Vitals:   01/18/24 2005  BP: (!) 135/99  Pulse: 83  Resp: 18  Temp: 98.4 F (36.9 C)  SpO2: 99%     General: Alert, well-appearing, no distress.  CV:  Good peripheral perfusion.  Resp:  Normal effort.  Abd:  Soft and nontender.  No distention.  Other:  No jaundice or scleral icterus.   ED Results / Procedures / Treatments   Labs (all labs ordered are listed, but only abnormal results are displayed) Labs Reviewed  URINALYSIS, ROUTINE W REFLEX MICROSCOPIC - Abnormal;  Notable for the following components:      Result Value   Color, Urine AMBER (*)    APPearance HAZY (*)    Hgb urine dipstick LARGE (*)    Protein, ur 30 (*)    All other components within normal limits  BASIC METABOLIC PANEL WITH GFR - Abnormal; Notable for the following components:   BUN 27 (*)    All other components within normal limits  CBC - Abnormal; Notable for the following components:   WBC 3.8 (*)    HCT 38.8 (*)    All other components within normal limits     EKG     RADIOLOGY  CT renal stone study: I independently viewed and interpreted the images; there is a small distal right ureteral stone.  Radiology report indicates the following:  IMPRESSION:  1. 3 mm distal right ureteral calculus with mild right-sided hydronephrosis.  2. Additional punctate nonobstructing right renal calculi.  3. Colonic diverticulosis.  4. Prostatomegaly.       PROCEDURES:  Critical Care performed: No  Procedures   MEDICATIONS ORDERED IN ED: Medications - No data to display   IMPRESSION / MDM / ASSESSMENT AND PLAN / ED COURSE  I reviewed the triage vital signs and the nursing notes.   Differential diagnosis includes, but is not limited to, ureteral stone, UTI, other cystitis, less likely malignancy.  Urinalysis shows significant RBCs and some WBCs but no bacteria.  BMP and CBC are unremarkable.  Will obtain a CT for further evaluation.  Patient's presentation is most consistent with acute complicated illness / injury requiring diagnostic workup.  ----------------------------------------- 10:51 PM on 01/18/2024 -----------------------------------------  CT shows a small distal right ureteral stone.  The patient is not having any active pain.  This likely explains the hematuria.  He is stable for discharge home at this time.  He feels comfortable and is eager to go home.  I counseled him on the results of the workup and plan of care, and answered all of his questions.  I  recommend that he follow up with urology in 1 to 2 weeks if he is still having hematuria.  I gave strict return precautions, and he expressed understanding.   FINAL CLINICAL IMPRESSION(S) / ED DIAGNOSES   Final diagnoses:  Gross hematuria  Ureteral stone     Rx / DC Orders   ED Discharge Orders     None        Note:  This document was prepared using Dragon voice recognition software and may include unintentional dictation errors.    Jacolyn Pae, MD 01/18/24 2252  "

## 2024-01-23 ENCOUNTER — Other Ambulatory Visit: Payer: Self-pay | Admitting: Infectious Diseases

## 2024-01-23 DIAGNOSIS — R319 Hematuria, unspecified: Secondary | ICD-10-CM

## 2024-02-08 ENCOUNTER — Other Ambulatory Visit: Payer: Self-pay | Admitting: Emergency Medicine

## 2024-02-08 DIAGNOSIS — Z87891 Personal history of nicotine dependence: Secondary | ICD-10-CM

## 2024-02-08 DIAGNOSIS — J432 Centrilobular emphysema: Secondary | ICD-10-CM

## 2024-02-08 DIAGNOSIS — Z122 Encounter for screening for malignant neoplasm of respiratory organs: Secondary | ICD-10-CM

## 2024-03-06 ENCOUNTER — Ambulatory Visit
